# Patient Record
Sex: Male | Born: 1965
Health system: Southern US, Community
[De-identification: ages and names within clinical notes are randomized; demographics above are authoritative.]

---

## 2017-11-30 DIAGNOSIS — R1011 Right upper quadrant pain: Secondary | ICD-10-CM | POA: Diagnosis not present

## 2019-07-24 ENCOUNTER — Emergency Department (HOSPITAL_COMMUNITY)
Admission: EM | Admit: 2019-07-24 | Discharge: 2019-07-25 | Disposition: A | Discharge status: Home / Self Care | Payer: 59 | Attending: Emergency Medicine | Admitting: Emergency Medicine

## 2019-07-24 ENCOUNTER — Encounter (HOSPITAL_COMMUNITY): Payer: Self-pay | Admitting: Emergency Medicine

## 2019-07-24 ENCOUNTER — Other Ambulatory Visit: Payer: Self-pay

## 2019-07-24 ENCOUNTER — Emergency Department (HOSPITAL_COMMUNITY): Payer: 59

## 2019-07-24 DIAGNOSIS — N200 Calculus of kidney: Secondary | ICD-10-CM | POA: Insufficient documentation

## 2019-07-24 DIAGNOSIS — R319 Hematuria, unspecified: Secondary | ICD-10-CM | POA: Diagnosis present

## 2019-07-24 LAB — BASIC METABOLIC PANEL
Anion gap: 8 (ref 5–15)
BUN: 14 mg/dL (ref 6–20)
CO2: 26 mmol/L (ref 22–32)
Calcium: 9.2 mg/dL (ref 8.9–10.3)
Chloride: 104 mmol/L (ref 98–111)
Creatinine, Ser: 0.94 mg/dL (ref 0.61–1.24)
GFR calc Af Amer: >60 mL/min (ref >60)
GFR calc non Af Amer: >60 mL/min (ref >60)
Glucose, Bld: 109 mg/dL — ABNORMAL HIGH (ref 70–99)
Potassium: 4 mmol/L (ref 3.5–5.1)
Sodium: 138 mmol/L (ref 135–145)

## 2019-07-24 LAB — CBC
HCT: 47.3 % (ref 39.0–52.0)
Hemoglobin: 15.9 g/dL (ref 13.0–17.0)
MCH: 32.4 pg (ref 26.0–34.0)
MCHC: 33.6 g/dL (ref 30.0–36.0)
MCV: 96.5 fL (ref 80.0–100.0)
Platelets: 215 10*3/uL (ref 150–400)
RBC: 4.9 MIL/uL (ref 4.22–5.81)
RDW: 11.8 % (ref 11.5–15.5)
WBC: 6.3 10*3/uL (ref 4.0–10.5)
nRBC: 0 % (ref 0.0–0.2)

## 2019-07-24 LAB — URINALYSIS, ROUTINE W REFLEX MICROSCOPIC
Bilirubin Urine: NEGATIVE
Glucose, UA: NEGATIVE mg/dL
Ketones, ur: NEGATIVE mg/dL
Leukocytes,Ua: NEGATIVE
Nitrite: NEGATIVE
Protein, ur: 100 mg/dL — AB
RBC / HPF: >50 RBC/hpf — ABNORMAL HIGH (ref 0–5)
Specific Gravity, Urine: 1.018 (ref 1.005–1.030)
pH: 6 (ref 5.0–8.0)

## 2019-07-24 MED ORDER — TAMSULOSIN HCL 0.4 MG PO CAPS: 0.4000 mg | ORAL_CAPSULE | Freq: Every day | ORAL | 0 refills | Status: AC

## 2019-07-24 MED ORDER — ONDANSETRON HCL 4 MG/2ML IJ SOLN
4.0000 mg | Freq: Once | INTRAMUSCULAR | Status: AC
  Administered 2019-07-25: 4 mg via INTRAVENOUS
  Filled 2019-07-24: qty 2

## 2019-07-24 MED ORDER — ONDANSETRON 8 MG PO TBDP: ORAL_TABLET | ORAL | 0 refills | Status: AC

## 2019-07-24 MED ORDER — DICLOFENAC SODIUM ER 100 MG PO TB24: 100.0000 mg | ORAL_TABLET | Freq: Every day | ORAL | 0 refills | Status: AC

## 2019-07-24 MED ORDER — TAMSULOSIN HCL 0.4 MG PO CAPS
0.4000 mg | ORAL_CAPSULE | ORAL | Status: AC
  Administered 2019-07-25: 0.4 mg via ORAL
  Filled 2019-07-24: qty 1

## 2019-07-24 MED ORDER — KETOROLAC TROMETHAMINE 30 MG/ML IJ SOLN
30.0000 mg | Freq: Once | INTRAMUSCULAR | Status: AC
  Administered 2019-07-25: 30 mg via INTRAVENOUS
  Filled 2019-07-24: qty 1

## 2019-07-24 NOTE — ED Triage Notes (Signed)
Pt c/o blood in urine and lower abd pain started monday

## 2019-07-25 ENCOUNTER — Encounter (HOSPITAL_COMMUNITY): Payer: Self-pay | Admitting: Emergency Medicine

## 2019-07-25 NOTE — ED Provider Notes (Signed)
[facility]-EMERGENCY DEPT Provider Note   CSN: [id number] Arrival date & time: 07/24/19  2036     History Chief Complaint  Patient presents with  . Hematuria    Carl Gross is a 54 y.o. male.  The history is provided by the patient.  Hematuria This is a new problem. The current episode started more than 2 days ago. The problem occurs constantly. The problem has not changed since onset.Associated symptoms include abdominal pain. Pertinent negatives include no chest pain, no headaches and no shortness of breath. Nothing aggravates the symptoms. Nothing relieves the symptoms. He has tried nothing for the symptoms. The treatment provided no relief.  3 days of hematuria and RLQ pain.  No f/c/r.  No n/v/d.      History reviewed. No pertinent past medical history.  There are no problems to display for this patient.   History reviewed. No pertinent surgical history.     History reviewed. No pertinent family history.  Social History   Tobacco Use  . Smoking status: Not on file  Substance Use Topics  . Alcohol use: Not on file  . Drug use: Not on file    Home Medications Prior to Admission medications   Medication Sig Start Date End Date Taking? Authorizing Provider  Diclofenac Sodium CR 100 MG 24 hr tablet Take 1 tablet (100 mg total) by mouth daily. 07/24/19   Jonea Bukowski, MD  ondansetron (ZOFRAN ODT) 8 MG disintegrating tablet 8mg  ODT q8 hours prn nausea 07/24/19   Jemiah Cuadra, MD  tamsulosin (FLOMAX) 0.4 MG CAPS capsule Take 1 capsule (0.4 mg total) by mouth daily. 07/24/19   Abena Erdman, MD    Allergies    Patient has no allergy information on record.  Review of Systems   Review of Systems  Constitutional: Negative for fever.  HENT: Negative for congestion.   Eyes: Negative for visual disturbance.  Respiratory: Negative for shortness of breath.   Cardiovascular: Negative for chest pain.  Gastrointestinal: Positive for  abdominal pain. Negative for nausea and vomiting.  Genitourinary: Positive for hematuria. Negative for dysuria.  Musculoskeletal: Negative for arthralgias.  Skin: Negative for color change.  Neurological: Negative for headaches.  Psychiatric/Behavioral: Negative for agitation.  All other systems reviewed and are negative.   Physical Exam Updated Vital Signs BP 137/83   Pulse 83   Temp 97.9 F (36.6 C) (Oral)   Resp 15   Ht 5' 5.35" (1.66 m)   Wt 70.4 kg   SpO2 98%   BMI 25.53 kg/m   Physical Exam Vitals and nursing note reviewed.  Constitutional:      General: He is not in acute distress.    Appearance: Normal appearance.  HENT:     Head: Normocephalic and atraumatic.     Nose: Nose normal.  Eyes:     Conjunctiva/sclera: Conjunctivae normal.     Pupils: Pupils are equal, round, and reactive to light.  Cardiovascular:     Rate and Rhythm: Normal rate and regular rhythm.     Pulses: Normal pulses.     Heart sounds: Normal heart sounds.  Pulmonary:     Effort: Pulmonary effort is normal.     Breath sounds: Normal breath sounds.  Abdominal:     General: Abdomen is flat. Bowel sounds are normal.     Tenderness: There is no abdominal tenderness. There is no guarding or rebound.  Musculoskeletal:        General: Normal range of motion.  Cervical back: Normal range of motion and neck supple.  Skin:    General: Skin is warm and dry.     Capillary Refill: Capillary refill takes less than 2 seconds.  Neurological:     General: No focal deficit present.     Mental Status: He is alert and oriented to person, place, and time.     Deep Tendon Reflexes: Reflexes normal.  Psychiatric:        Mood and Affect: Mood normal.        Behavior: Behavior normal.     ED Results / Procedures / Treatments   Labs (all labs ordered are listed, but only abnormal results are displayed) Results for orders placed or performed during the hospital encounter of 07/24/19  Urinalysis,  Routine w reflex microscopic- may I&O cath if menses  Result Value Ref Range   Color, Urine AMBER (A) YELLOW   APPearance CLOUDY (A) CLEAR   Specific Gravity, Urine 1.018 1.005 - 1.030   pH 6.0 5.0 - 8.0   Glucose, UA NEGATIVE NEGATIVE mg/dL   Hgb urine dipstick LARGE (A) NEGATIVE   Bilirubin Urine NEGATIVE NEGATIVE   Ketones, ur NEGATIVE NEGATIVE mg/dL   Protein, ur 100 (A) NEGATIVE mg/dL   Nitrite NEGATIVE NEGATIVE   Leukocytes,Ua NEGATIVE NEGATIVE   RBC / HPF >50 (H) 0 - 5 RBC/hpf   WBC, UA 6-10 0 - 5 WBC/hpf   Bacteria, UA RARE (A) NONE SEEN  Basic metabolic panel  Result Value Ref Range   Sodium 138 135 - 145 mmol/L   Potassium 4.0 3.5 - 5.1 mmol/L   Chloride 104 98 - 111 mmol/L   CO2 26 22 - 32 mmol/L   Glucose, Bld 109 (H) 70 - 99 mg/dL   BUN 14 6 - 20 mg/dL   Creatinine, Ser 0.94 0.61 - 1.24 mg/dL   Calcium 9.2 8.9 - 10.3 mg/dL   GFR calc non Af Amer >60 >60 mL/min   GFR calc Af Amer >60 >60 mL/min   Anion gap 8 5 - 15  CBC  Result Value Ref Range   WBC 6.3 4.0 - 10.5 K/uL   RBC 4.90 4.22 - 5.81 MIL/uL   Hemoglobin 15.9 13.0 - 17.0 g/dL   HCT 47.3 39.0 - 52.0 %   MCV 96.5 80.0 - 100.0 fL   MCH 32.4 26.0 - 34.0 pg   MCHC 33.6 30.0 - 36.0 g/dL   RDW 11.8 11.5 - 15.5 %   Platelets 215 150 - 400 K/uL   nRBC 0.0 0.0 - 0.2 %   CT Renal Stone Study  Result Date: 07/24/2019 CLINICAL DATA:  Hematuria and lower abdominal pain. EXAM: CT ABDOMEN AND PELVIS WITHOUT CONTRAST TECHNIQUE: Multidetector CT imaging of the abdomen and pelvis was performed following the standard protocol without IV contrast. COMPARISON:  None. FINDINGS: Lower chest: No acute abnormality. Hepatobiliary: No focal liver abnormality is seen. A 6 mm calcific density is seen within the region in between the right and left lobes of the liver. No gallstones, gallbladder wall thickening, or biliary dilatation. Pancreas: Unremarkable. No pancreatic ductal dilatation or surrounding inflammatory changes. Spleen:  Normal in size without focal abnormality. Adrenals/Urinary Tract: Adrenal glands are unremarkable. Kidneys are normal in size without focal lesions. There is moderate severity right-sided hydronephrosis and hydroureter with a 3 mm obstructing renal stones seen within the distal right ureter. A 4 mm nonobstructing renal stone is seen within the mid left kidney. Bladder is unremarkable. Stomach/Bowel: Stomach is within normal  limits. Appendix appears normal. No evidence of bowel wall thickening, distention, or inflammatory changes. Vascular/Lymphatic: No significant vascular findings are present. No enlarged abdominal or pelvic lymph nodes. Reproductive: Prostate is unremarkable. Other: No abdominal wall hernia or abnormality. No abdominopelvic ascites. Musculoskeletal: No acute or significant osseous findings. IMPRESSION: 1. 3 mm obstructing renal stone within the distal right ureter with moderate severity right-sided hydronephrosis and hydroureter. 2. 4 mm nonobstructing renal stone within the mid left kidney. Electronically Signed   By: Aram Candela M.D.   On: 07/24/2019 23:46    Radiology CT Renal Stone Study  Result Date: 07/24/2019 CLINICAL DATA:  Hematuria and lower abdominal pain. EXAM: CT ABDOMEN AND PELVIS WITHOUT CONTRAST TECHNIQUE: Multidetector CT imaging of the abdomen and pelvis was performed following the standard protocol without IV contrast. COMPARISON:  None. FINDINGS: Lower chest: No acute abnormality. Hepatobiliary: No focal liver abnormality is seen. A 6 mm calcific density is seen within the region in between the right and left lobes of the liver. No gallstones, gallbladder wall thickening, or biliary dilatation. Pancreas: Unremarkable. No pancreatic ductal dilatation or surrounding inflammatory changes. Spleen: Normal in size without focal abnormality. Adrenals/Urinary Tract: Adrenal glands are unremarkable. Kidneys are normal in size without focal lesions. There is moderate severity  right-sided hydronephrosis and hydroureter with a 3 mm obstructing renal stones seen within the distal right ureter. A 4 mm nonobstructing renal stone is seen within the mid left kidney. Bladder is unremarkable. Stomach/Bowel: Stomach is within normal limits. Appendix appears normal. No evidence of bowel wall thickening, distention, or inflammatory changes. Vascular/Lymphatic: No significant vascular findings are present. No enlarged abdominal or pelvic lymph nodes. Reproductive: Prostate is unremarkable. Other: No abdominal wall hernia or abnormality. No abdominopelvic ascites. Musculoskeletal: No acute or significant osseous findings. IMPRESSION: 1. 3 mm obstructing renal stone within the distal right ureter with moderate severity right-sided hydronephrosis and hydroureter. 2. 4 mm nonobstructing renal stone within the mid left kidney. Electronically Signed   By: Aram Candela M.D.   On: 07/24/2019 23:46    Procedures Procedures (including critical care time)  Medications Ordered in ED Medications  ketorolac (TORADOL) 30 MG/ML injection 30 mg (has no administration in time range)  tamsulosin (FLOMAX) capsule 0.4 mg (has no administration in time range)  ondansetron (ZOFRAN) injection 4 mg (has no administration in time range)    ED Course  I have reviewed the triage vital signs and the nursing notes.  Pertinent labs & imaging results that were available during my care of the patient were reviewed by me and considered in my medical decision making (see chart for details).    Pain is markedly improved.  No vomiting.  Patient is informed of stones in the ureter in the right and one in the kidney on the left and need to strain all urine take pain medication as prescribed and follow up in a week at urology.  Patient verbalizes understanding  And agrees to follow up.   Carl Gross was evaluated in Emergency Department on 07/25/2019 for the symptoms described in the history of present  illness. He was evaluated in the context of the global COVID-19 pandemic, which necessitated consideration that the patient might be at risk for infection with the SARS-CoV-2 virus that causes COVID-19. Institutional protocols and algorithms that pertain to the evaluation of patients at risk for COVID-19 are in a state of rapid change based on information released by regulatory bodies including the CDC and federal and state organizations. These policies  and algorithms were followed during the patient's care in the ED.  Final Clinical Impression(s) / ED Diagnoses Final diagnoses:  Kidney stones   Return for weakness, numbness, changes in vision or speech, fevers >100.4 unrelieved by medication, shortness of breath, intractable vomiting, or diarrhea, abdominal pain, Inability to tolerate liquids or food, cough, altered mental status or any concerns. No signs of systemic illness or infection. The patient is nontoxic-appearing on exam and vital signs are within normal limits.   I have reviewed the triage vital signs and the nursing notes. Pertinent labs &imaging results that were available during my care of the patient were reviewed by me and considered in my medical decision making (see chart for details).  After history, exam, and medical workup I feel the patient has been appropriately medically screened and is safe for discharge home. Pertinent diagnoses were discussed with the patient. Patient was givenstrictreturn precautions.    Kiyo Heal, MD 07/25/19 [id number]

## 2021-06-26 IMAGING — CT CT RENAL STONE PROTOCOL
2 of 4 series · 16 of 46 positions shown, 18 images · non-contrast
Comparison: None.

CLINICAL DATA: Hematuria and lower abdominal pain.

EXAM:
CT ABDOMEN AND PELVIS WITHOUT CONTRAST
TECHNIQUE: Multidetector CT imaging of the abdomen and pelvis was performed
following the standard protocol without IV contrast.

[Series 2: axial st · axial · 0.71mm/px · z∈[+1080,+1506]mm · 13 of 97 slices shown, 15 images]
[im 6/97  soft-tissue]
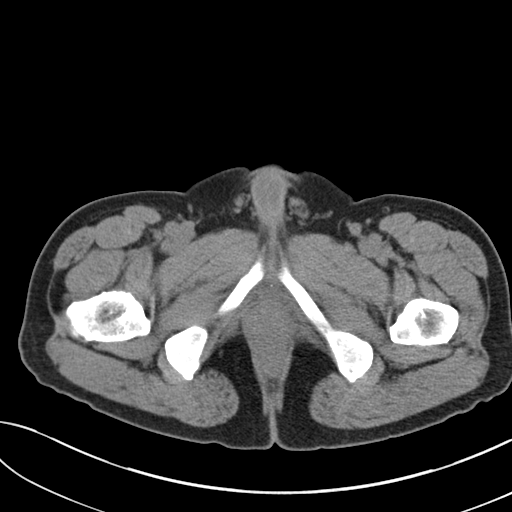
[im 6/97  bone]
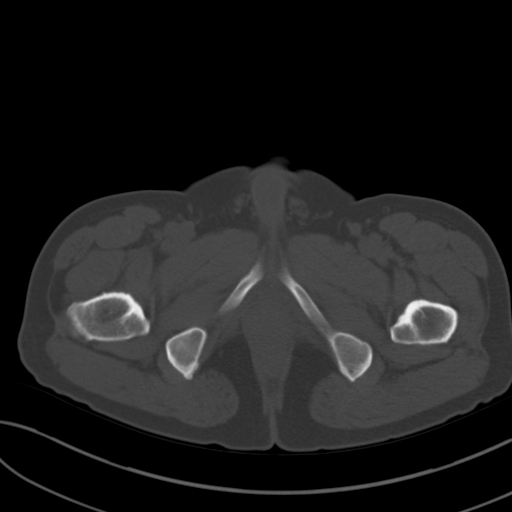
[im 11/97  soft-tissue]
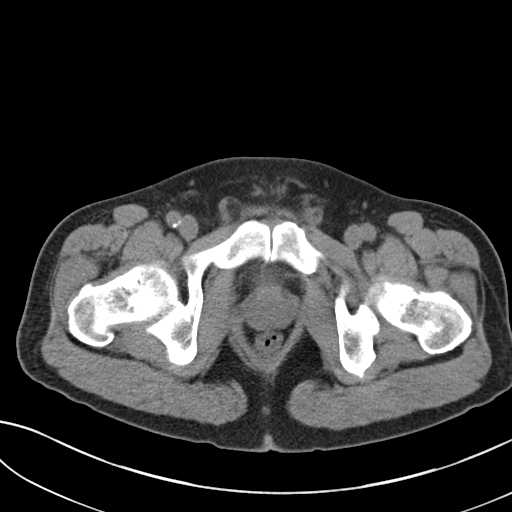
[im 22/97  soft-tissue]
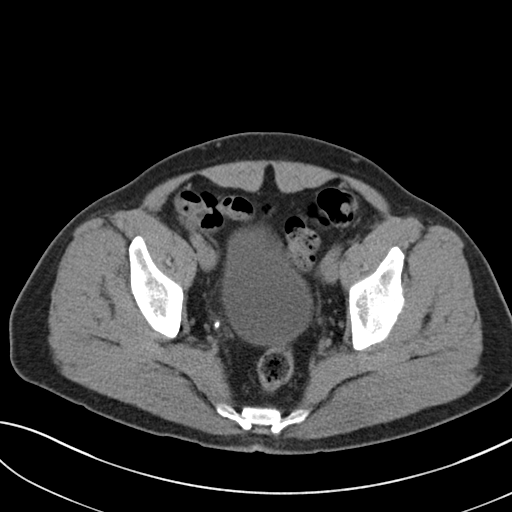
[im 27/97  soft-tissue]
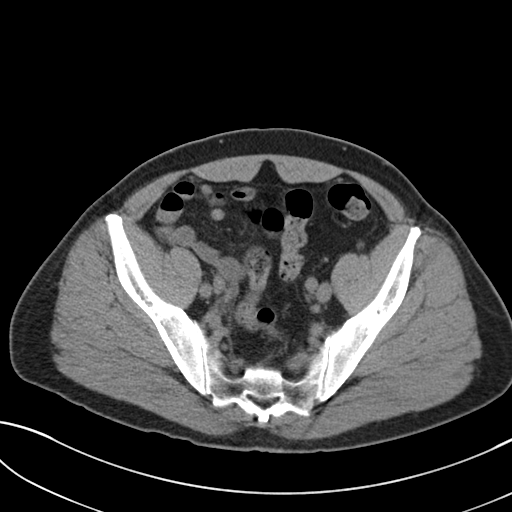
[im 33/97  soft-tissue]
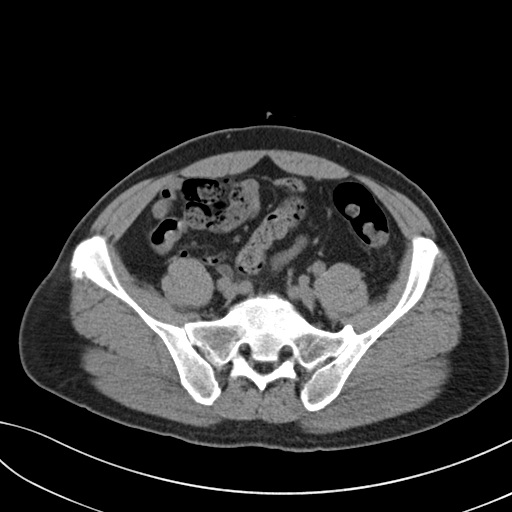
[im 43/97  soft-tissue]
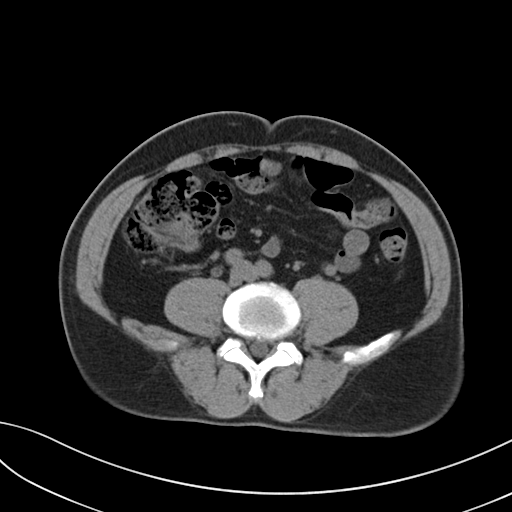
[im 49/97  soft-tissue]
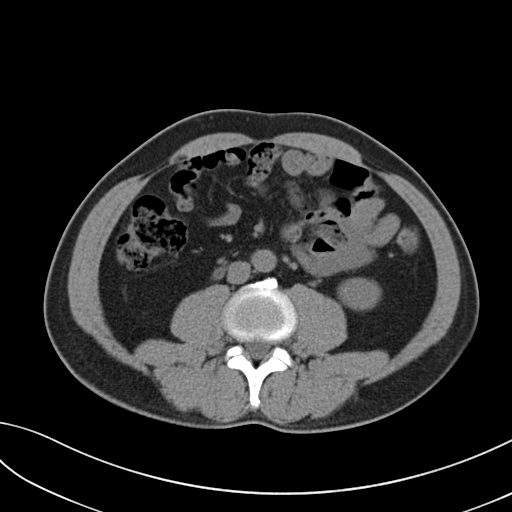
[im 54/97  soft-tissue]
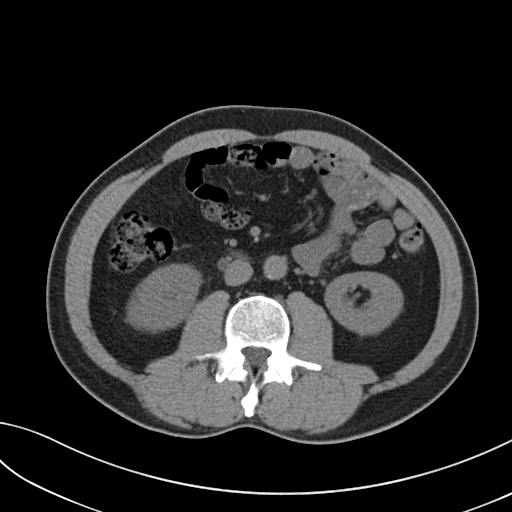
[im 65/97  soft-tissue]
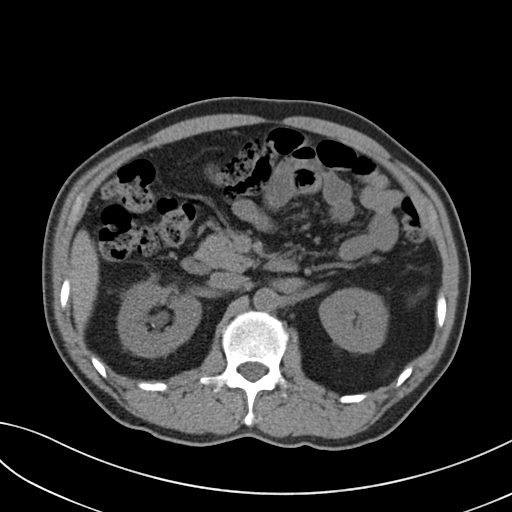
[im 65/97  bone]
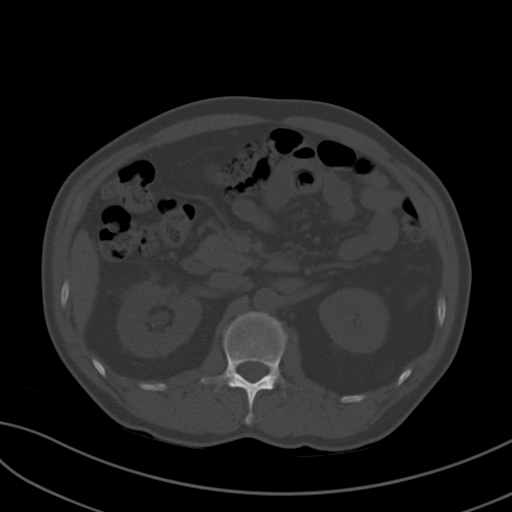
[im 70/97  soft-tissue]
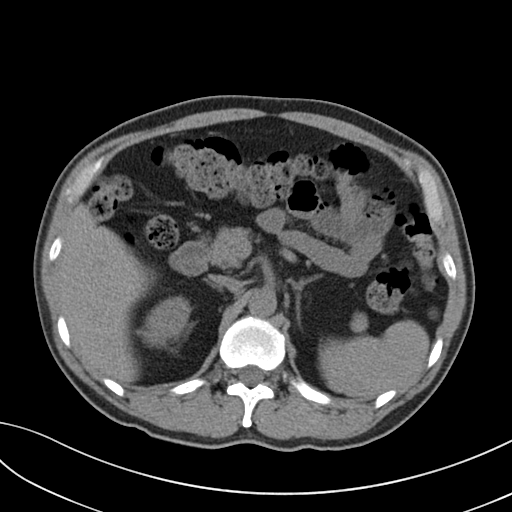
[im 75/97  soft-tissue]
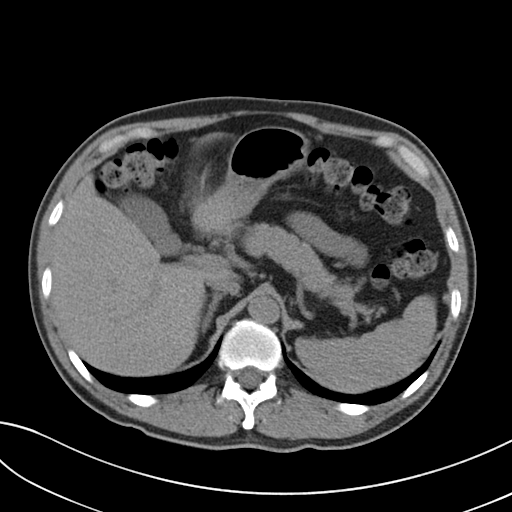
[im 86/97  soft-tissue]
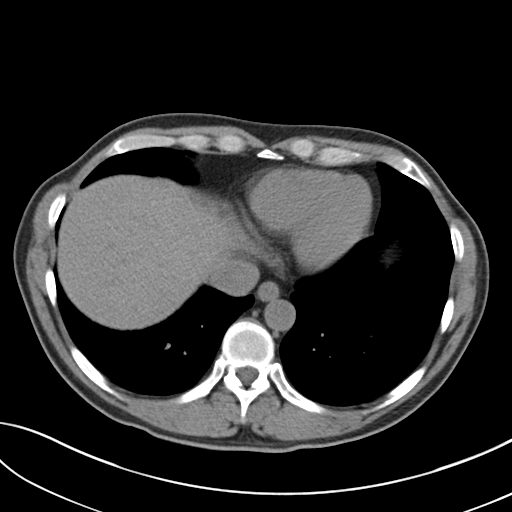
[im 91/97  soft-tissue]
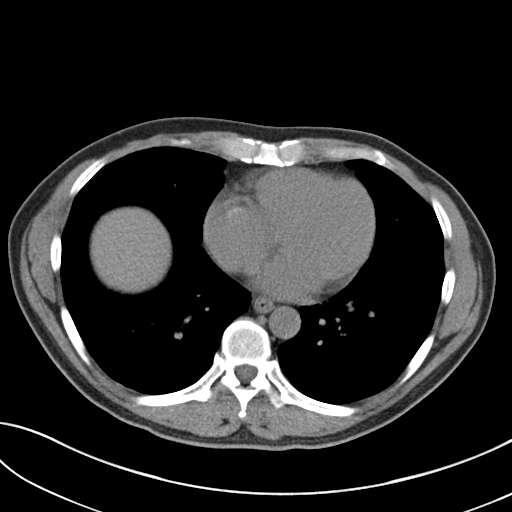

[Series 4: coronal · coronal · 0.71mm/px · 3 of 150 slices shown]
[im 50/150  soft-tissue]
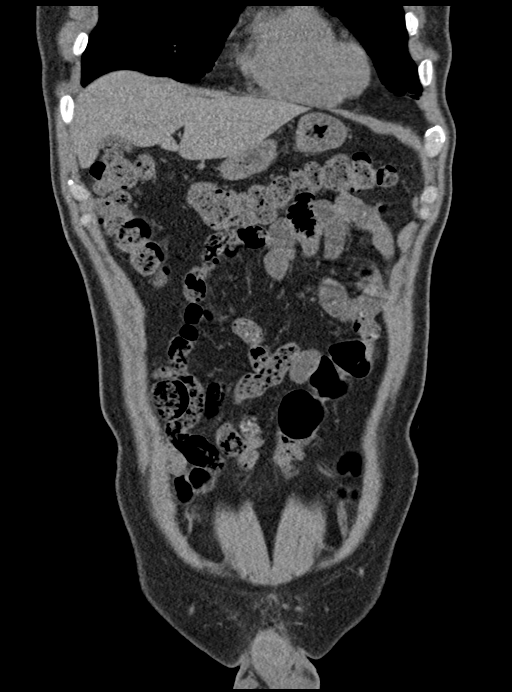
[im 67/150  soft-tissue]
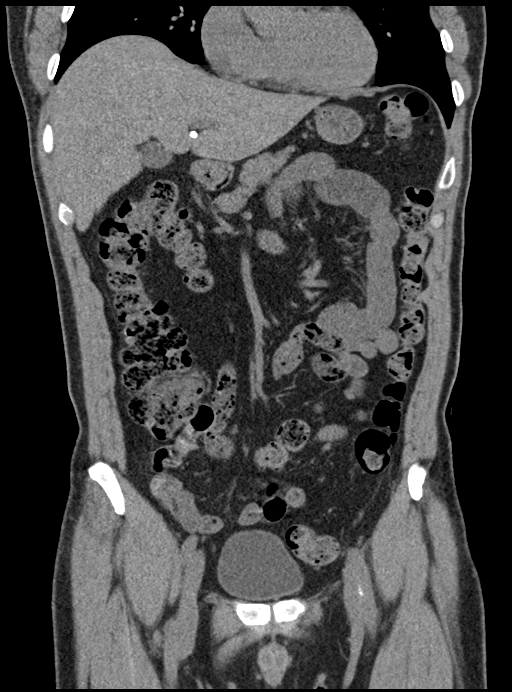
[im 83/150  soft-tissue]
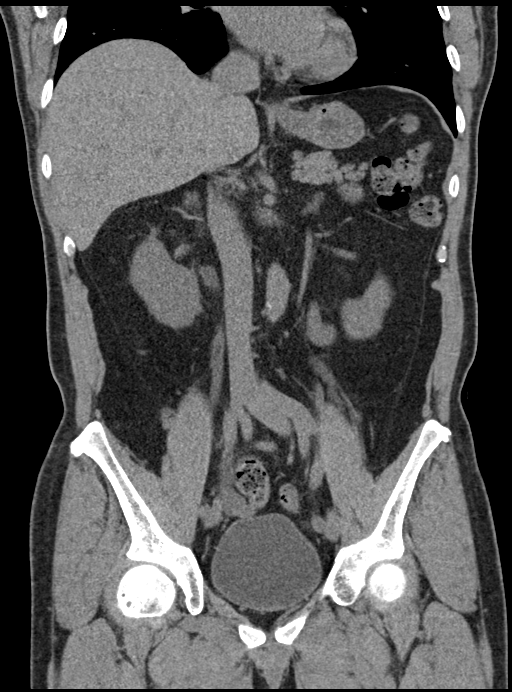

[16 of 46 positions shown; findings below may reference images not displayed]

FINDINGS: Lower chest: No acute abnormality.

Hepatobiliary: No focal liver abnormality is seen. A 6 mm calcific
density is seen within the region in between the right and left
lobes of the liver. No gallstones, gallbladder wall thickening, or
biliary dilatation.

Pancreas: Unremarkable. No pancreatic ductal dilatation or
surrounding inflammatory changes.

Spleen: Normal in size without focal abnormality.

Adrenals/Urinary Tract: Adrenal glands are unremarkable. Kidneys are
normal in size without focal lesions. There is moderate severity
right-sided hydronephrosis and hydroureter with a 3 mm obstructing
renal stones seen within the distal right ureter. A 4 mm
nonobstructing renal stone is seen within the mid left kidney.
Bladder is unremarkable.

Stomach/Bowel: Stomach is within normal limits. Appendix appears
normal. No evidence of bowel wall thickening, distention, or
inflammatory changes.

Vascular/Lymphatic: No significant vascular findings are present. No
enlarged abdominal or pelvic lymph nodes.

Reproductive: Prostate is unremarkable.

Other: No abdominal wall hernia or abnormality. No abdominopelvic
ascites.

Musculoskeletal: No acute or significant osseous findings.
IMPRESSION: 1. 3 mm obstructing renal stone within the distal right ureter with
moderate severity right-sided hydronephrosis and hydroureter.
2. 4 mm nonobstructing renal stone within the mid left kidney.
